# Patient Record
Sex: Female | Born: 1935 | Race: White | Hispanic: No | Smoking: Former smoker
Health system: Southern US, Community
[De-identification: ages and names within clinical notes are randomized; demographics above are authoritative.]

## PROBLEM LIST (undated history)

## (undated) DIAGNOSIS — I10 Essential (primary) hypertension: Secondary | ICD-10-CM

## (undated) DIAGNOSIS — H409 Unspecified glaucoma: Secondary | ICD-10-CM

## (undated) DIAGNOSIS — F0390 Unspecified dementia without behavioral disturbance: Secondary | ICD-10-CM

## (undated) DIAGNOSIS — E785 Hyperlipidemia, unspecified: Secondary | ICD-10-CM

## (undated) DIAGNOSIS — F419 Anxiety disorder, unspecified: Secondary | ICD-10-CM

## (undated) HISTORY — PX: ABDOMINAL SURGERY: SHX537

## (undated) HISTORY — PX: ABDOMINAL HYSTERECTOMY: SHX81

---

## 2013-03-16 ENCOUNTER — Emergency Department (HOSPITAL_BASED_OUTPATIENT_CLINIC_OR_DEPARTMENT_OTHER)
Admission: EM | Admit: 2013-03-16 | Discharge: 2013-03-16 | Disposition: A | Discharge status: Home / Self Care | Payer: Medicare Other | Attending: Emergency Medicine | Admitting: Emergency Medicine

## 2013-03-16 ENCOUNTER — Encounter (HOSPITAL_BASED_OUTPATIENT_CLINIC_OR_DEPARTMENT_OTHER): Payer: Self-pay | Admitting: Emergency Medicine

## 2013-03-16 ENCOUNTER — Emergency Department (HOSPITAL_BASED_OUTPATIENT_CLINIC_OR_DEPARTMENT_OTHER): Payer: Medicare Other

## 2013-03-16 DIAGNOSIS — F29 Unspecified psychosis not due to a substance or known physiological condition: Secondary | ICD-10-CM | POA: Insufficient documentation

## 2013-03-16 DIAGNOSIS — R319 Hematuria, unspecified: Secondary | ICD-10-CM

## 2013-03-16 DIAGNOSIS — R059 Cough, unspecified: Secondary | ICD-10-CM | POA: Insufficient documentation

## 2013-03-16 DIAGNOSIS — R05 Cough: Secondary | ICD-10-CM | POA: Insufficient documentation

## 2013-03-16 DIAGNOSIS — R509 Fever, unspecified: Secondary | ICD-10-CM

## 2013-03-16 DIAGNOSIS — F0390 Unspecified dementia without behavioral disturbance: Secondary | ICD-10-CM | POA: Insufficient documentation

## 2013-03-16 HISTORY — DX: Unspecified dementia, unspecified severity, without behavioral disturbance, psychotic disturbance, mood disturbance, and anxiety: F03.90

## 2013-03-16 LAB — BASIC METABOLIC PANEL
CO2: 26 mEq/L (ref 19–32)
Calcium: 9.2 mg/dL (ref 8.4–10.5)
Creatinine, Ser: 1 mg/dL (ref 0.50–1.10)
GFR calc Af Amer: 57 mL/min — ABNORMAL LOW (ref >90)
Glucose, Bld: 121 mg/dL — ABNORMAL HIGH (ref 70–99)
Sodium: 137 mEq/L (ref 137–147)

## 2013-03-16 LAB — CG4 I-STAT (LACTIC ACID): Lactic Acid, Venous: 0.81 mmol/L (ref 0.5–2.2)

## 2013-03-16 LAB — CBC WITH DIFFERENTIAL/PLATELET
Basophils Absolute: 0 10*3/uL (ref 0.0–0.1)
Basophils Relative: 0 % (ref 0–1)
Eosinophils Relative: 0 % (ref 0–5)
HCT: 37.7 % (ref 36.0–46.0)
Lymphs Abs: 0.8 10*3/uL (ref 0.7–4.0)
MCHC: 32.6 g/dL (ref 30.0–36.0)
MCV: 96.4 fL (ref 78.0–100.0)
Monocytes Absolute: 0.8 10*3/uL (ref 0.1–1.0)
Neutro Abs: 4.8 10*3/uL (ref 1.7–7.7)
Platelets: 142 10*3/uL — ABNORMAL LOW (ref 150–400)
RDW: 13.8 % (ref 11.5–15.5)
WBC: 6.4 10*3/uL (ref 4.0–10.5)

## 2013-03-16 LAB — URINALYSIS, ROUTINE W REFLEX MICROSCOPIC
Bilirubin Urine: NEGATIVE
Protein, ur: 30 mg/dL — AB
Specific Gravity, Urine: 1.021 (ref 1.005–1.030)
Urobilinogen, UA: 1 mg/dL (ref 0.0–1.0)
pH: 6 (ref 5.0–8.0)

## 2013-03-16 LAB — URINE MICROSCOPIC-ADD ON

## 2013-03-16 MED ORDER — DEXTROSE 5 % IV SOLN
1.0000 g | Freq: Once | INTRAVENOUS | Status: AC
  Administered 2013-03-16: 1 g via INTRAVENOUS

## 2013-03-16 MED ORDER — ACETAMINOPHEN 500 MG PO TABS: 500.0000 mg | ORAL_TABLET | Freq: Four times a day (QID) | ORAL | Status: DC | PRN

## 2013-03-16 MED ORDER — SODIUM CHLORIDE 0.9 % IV BOLUS (SEPSIS)
500.0000 mL | Freq: Once | INTRAVENOUS | Status: AC
  Administered 2013-03-16: 18:00:00 via INTRAVENOUS

## 2013-03-16 MED ORDER — ACETAMINOPHEN 325 MG PO TABS
ORAL_TABLET | ORAL | Status: AC
  Filled 2013-03-16: qty 2

## 2013-03-16 MED ORDER — ACETAMINOPHEN 325 MG PO TABS
650.0000 mg | ORAL_TABLET | Freq: Once | ORAL | Status: AC
  Administered 2013-03-16: 650 mg via ORAL

## 2013-03-16 MED ORDER — BACITRACIN-NEOMYCIN-POLYMYXIN 400-5-5000 EX OINT: 1.0000 "application " | TOPICAL_OINTMENT | Freq: Two times a day (BID) | CUTANEOUS | Status: DC

## 2013-03-16 MED ORDER — CEPHALEXIN 500 MG PO CAPS: 500.0000 mg | ORAL_CAPSULE | Freq: Four times a day (QID) | ORAL | Status: DC

## 2013-03-16 MED ORDER — CEFTRIAXONE SODIUM 1 G IJ SOLR
INTRAMUSCULAR | Status: AC
  Filled 2013-03-16: qty 10

## 2013-03-16 NOTE — ED Notes (Signed)
Called [facility] and spoke with Oneida Alar to inform them of pts return.

## 2013-03-16 NOTE — ED Provider Notes (Signed)
CSN: [id number]     Arrival date & time 03/16/13  1623 History   First MD Initiated Contact with Patient 03/16/13 1728     Chief Complaint  Patient presents with  . Altered Mental Status   (Consider location/radiation/quality/duration/timing/severity/associated sxs/prior Treatment) HPI 79 year old female with history of dementia who lives in an assisted living care facility presents today with fever and some increased confusion. Her daughter states that she saw her 2 evenings ago and that she was well at that time. She was called by the assisted living care facility with the fact that her mother had a fever today. She has had some cough. May have also been suspicious that she has a urinary tract infection but do not reveal any specific details indicate this. She has been taking by mouth as per usual per report from the assisted living care facility. Patient denies any nausea, vomiting, or diarrhea. History was obtained from the patient and from her daughter. Past Medical History  Diagnosis Date  . Dementia    Past Surgical History  Procedure Laterality Date  . Abdominal hysterectomy     History reviewed. No pertinent family history. History  Substance Use Topics  . Smoking status: Not on file  . Smokeless tobacco: Not on file  . Alcohol Use: No   OB History   Grav Para Term Preterm Abortions TAB SAB Ect Mult Living                 Review of Systems  All other systems reviewed and are negative.    Allergies  Review of patient's allergies indicates no known allergies.  Home Medications  No current outpatient prescriptions on file. BP 139/86  Pulse 81  Temp(Src) 101.1 F (38.4 C) (Rectal)  Resp 16  Ht 5\' 2"  (1.575 m)  Wt 110 lb (49.896 kg)  BMI 20.11 kg/m2  SpO2 96% Physical Exam  Nursing note and vitals reviewed. Constitutional: She is oriented to person, place, and time. She appears well-developed and well-nourished.  HENT:  Head: Normocephalic and atraumatic.   Right Ear: External ear normal.  Left Ear: External ear normal.  Nose: Nose normal.  Mouth/Throat: Oropharynx is clear and moist.  Eyes: Conjunctivae and EOM are normal. Pupils are equal, round, and reactive to light.  Neck: Normal range of motion. Neck supple.  Cardiovascular: Normal rate, regular rhythm, normal heart sounds and intact distal pulses.   Pulmonary/Chest: Effort normal and breath sounds normal.  Abdominal: Soft. Bowel sounds are normal.  Musculoskeletal: Normal range of motion.  Neurological: She is alert and oriented to person, place, and time. She has normal reflexes.  Skin: Skin is warm and dry.  Psychiatric: She has a normal mood and affect. Her behavior is normal. Thought content normal.    ED Course  Procedures (including critical care time) Labs Review Labs Reviewed  URINALYSIS, ROUTINE W REFLEX MICROSCOPIC - Abnormal; Notable for the following:    Hgb urine dipstick LARGE (*)    Protein, ur 30 (*)    All other components within normal limits  CBC WITH DIFFERENTIAL - Abnormal; Notable for the following:    Platelets 142 (*)    Monocytes Relative 13 (*)    All other components within normal limits  BASIC METABOLIC PANEL - Abnormal; Notable for the following:    Glucose, Bld 121 (*)    GFR calc non Af Amer 50 (*)    GFR calc Af Amer 57 (*)    All other components within normal limits  CULTURE, BLOOD (ROUTINE X 2)  CULTURE, BLOOD (ROUTINE X 2)  URINE MICROSCOPIC-ADD ON  CG4 I-STAT (LACTIC ACID)   Imaging Review Dg Chest 2 View  03/16/2013   CLINICAL DATA:  Fever.  Altered mental status.  EXAM: CHEST  2 VIEW  COMPARISON:  None.  FINDINGS: Heart size is within normal limits. Pulmonary hyperinflation is consistent with COPD. No evidence of pulmonary infiltrate or pleural effusion. No mass or lymphadenopathy identified.  IMPRESSION: COPD.  No active disease.   Electronically Signed   By: Myles Rosenthal M.D.   On: 03/16/2013 19:00    EKG Interpretation    None       MDM   1. Fever   2. Hematuria    79 year old female Dement dementia and fever here. She has hematuria but has not have any white blood cells noted. She was to have her urine cultured and is given IV Rocephin. She is started on keflex and will continue until culture results.  I have discussed return precautions with patient and daughter and they voice understanding.     Hilario Quarry, MD 03/16/13 [phone]

## 2013-03-16 NOTE — ED Notes (Signed)
Daughter reports pt has had a fever today and has been increasingly confused.

## 2013-03-16 NOTE — ED Notes (Signed)
Pt is from [facility] daughter reports fever and confusion x 3 days

## 2013-03-16 NOTE — ED Notes (Signed)
Patient transported to CT 

## 2013-03-17 MED ORDER — BACITRACIN-NEOMYCIN-POLYMYXIN 400-5-5000 EX OINT: 1.0000 "application " | TOPICAL_OINTMENT | Freq: Two times a day (BID) | CUTANEOUS | Status: DC

## 2013-03-17 MED ORDER — ACETAMINOPHEN 500 MG PO TABS: ORAL_TABLET | ORAL | Status: DC

## 2013-03-17 MED ORDER — ADVANCED PROBIOTIC 10 PO CAPS: 1.0000 | ORAL_CAPSULE | Freq: Every morning | ORAL | Status: DC

## 2013-03-23 LAB — CULTURE, BLOOD (ROUTINE X 2): Culture: NO GROWTH

## 2015-03-13 ENCOUNTER — Emergency Department (HOSPITAL_COMMUNITY): Payer: Medicare Other

## 2015-03-13 ENCOUNTER — Encounter (HOSPITAL_COMMUNITY): Payer: Self-pay | Admitting: Emergency Medicine

## 2015-03-13 ENCOUNTER — Emergency Department (HOSPITAL_COMMUNITY)
Admission: EM | Admit: 2015-03-13 | Discharge: 2015-03-13 | Disposition: A | Discharge status: Home / Self Care | Payer: Medicare Other | Attending: Emergency Medicine | Admitting: Emergency Medicine

## 2015-03-13 DIAGNOSIS — Z87891 Personal history of nicotine dependence: Secondary | ICD-10-CM | POA: Diagnosis not present

## 2015-03-13 DIAGNOSIS — H409 Unspecified glaucoma: Secondary | ICD-10-CM | POA: Insufficient documentation

## 2015-03-13 DIAGNOSIS — Y9389 Activity, other specified: Secondary | ICD-10-CM | POA: Diagnosis not present

## 2015-03-13 DIAGNOSIS — Z79899 Other long term (current) drug therapy: Secondary | ICD-10-CM | POA: Insufficient documentation

## 2015-03-13 DIAGNOSIS — S0993XA Unspecified injury of face, initial encounter: Secondary | ICD-10-CM | POA: Diagnosis present

## 2015-03-13 DIAGNOSIS — Y92129 Unspecified place in nursing home as the place of occurrence of the external cause: Secondary | ICD-10-CM | POA: Diagnosis not present

## 2015-03-13 DIAGNOSIS — F419 Anxiety disorder, unspecified: Secondary | ICD-10-CM | POA: Diagnosis not present

## 2015-03-13 DIAGNOSIS — I10 Essential (primary) hypertension: Secondary | ICD-10-CM | POA: Diagnosis not present

## 2015-03-13 DIAGNOSIS — Y998 Other external cause status: Secondary | ICD-10-CM | POA: Diagnosis not present

## 2015-03-13 DIAGNOSIS — F0390 Unspecified dementia without behavioral disturbance: Secondary | ICD-10-CM | POA: Insufficient documentation

## 2015-03-13 DIAGNOSIS — Z8639 Personal history of other endocrine, nutritional and metabolic disease: Secondary | ICD-10-CM | POA: Insufficient documentation

## 2015-03-13 DIAGNOSIS — W01198A Fall on same level from slipping, tripping and stumbling with subsequent striking against other object, initial encounter: Secondary | ICD-10-CM | POA: Insufficient documentation

## 2015-03-13 DIAGNOSIS — W19XXXA Unspecified fall, initial encounter: Secondary | ICD-10-CM

## 2015-03-13 DIAGNOSIS — S0181XA Laceration without foreign body of other part of head, initial encounter: Secondary | ICD-10-CM | POA: Insufficient documentation

## 2015-03-13 HISTORY — DX: Essential (primary) hypertension: I10

## 2015-03-13 HISTORY — DX: Anxiety disorder, unspecified: F41.9

## 2015-03-13 HISTORY — DX: Unspecified glaucoma: H40.9

## 2015-03-13 HISTORY — DX: Hyperlipidemia, unspecified: E78.5

## 2015-03-13 NOTE — Progress Notes (Signed)
Pt pcp is at [facility]. [address], [address] [place], [place] [address] t: [phone] Visiting doctors Latrice Polite per daughter  

## 2015-03-13 NOTE — ED Notes (Signed)
Awake. Verbally responsive. A/O x4. Resp even and unlabored. No audible adventitious breath sounds noted. ABC's intact. Family at bedside. C-collar in place.

## 2015-03-13 NOTE — ED Notes (Signed)
Awake. Verbally responsive. A/O x4. Resp even and unlabored. No audible adventitious breath sounds noted. ABC's intact.  

## 2015-03-13 NOTE — ED Notes (Addendum)
Dr. Aileen PilotZammitt at bedside and applied staples to lac on forehead. Pt tolerated well.

## 2015-03-13 NOTE — Progress Notes (Signed)
CSW attempted to speak with patient. Nurse at bedside.   Angela Shaffer, LCSWA [phone] ED CSW 03/13/2015 1:20 PM

## 2015-03-13 NOTE — Discharge Instructions (Signed)
Follow up to have staples out in one week

## 2015-03-13 NOTE — ED Notes (Signed)
Pt arrived via EMS from [facility]) with report of pt tripping and falling unwitnessed hitting head on wooden ledge causing abrasion to rt forehead with controlled bleeding. EMS applied c-collar. Pt denies neck/back pain. Pt reported rt knee pain but no deformities/swelling noted. Pt denies dizziness/lightheadedness,visual disturbances, headaches, or LOC.

## 2015-03-13 NOTE — ED Notes (Signed)
Bed: WA16 Expected date:  Expected time:  Means of arrival:  Comments: EMS 
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
## 2015-03-13 NOTE — ED Notes (Signed)
Awake. Verbally responsive. A/O x4. Resp even and unlabored. No audible adventitious breath sounds noted. ABC's intact. C-collar remains in place. Family at bedside.

## 2015-03-13 NOTE — ED Provider Notes (Signed)
CSN: [id number]     Arrival date & time 03/13/15  1237 History   First MD Initiated Contact with Patient 03/13/15 1308     Chief Complaint  Patient presents with  . Fall     (Consider location/radiation/quality/duration/timing/severity/associated sxs/prior Treatment) Patient is a 81 y.o. female presenting with fall. The history is provided by a relative (Patient fell and hit her head no loss of consciousness).  Fall This is a new problem. The current episode started 1 to 2 hours ago. The problem occurs constantly. The problem has not changed since onset.Pertinent negatives include no chest pain, no abdominal pain and no headaches. Nothing aggravates the symptoms. Nothing relieves the symptoms.    Past Medical History  Diagnosis Date  . Dementia   . Hypertension   . Glaucoma   . HLD (hyperlipidemia)   . Anxiety    Past Surgical History  Procedure Laterality Date  . Abdominal hysterectomy    . Abdominal surgery     Family History  Problem Relation Age of Onset  . Family history unknown: Yes   Social History  Substance Use Topics  . Smoking status: Former Games developermoker  . Smokeless tobacco: None  . Alcohol Use: No   OB History    No data available     Review of Systems  Constitutional: Negative for appetite change and fatigue.  HENT: Negative for congestion, ear discharge and sinus pressure.   Eyes: Negative for discharge.  Respiratory: Negative for cough.   Cardiovascular: Negative for chest pain.  Gastrointestinal: Negative for abdominal pain and diarrhea.  Genitourinary: Negative for frequency and hematuria.  Musculoskeletal: Negative for back pain.  Skin: Negative for rash.  Neurological: Negative for seizures and headaches.  Psychiatric/Behavioral: Negative for hallucinations.      Allergies  Multivitamin  Home Medications   Prior to Admission medications   Medication Sig Start Date End Date Taking? Authorizing Provider  acetaminophen (TYLENOL) 325 MG  tablet Take 325 mg by mouth every 4 (four) hours as needed for mild pain, moderate pain or headache.   Yes Historical Provider, MD  acetaminophen (TYLENOL) 500 MG tablet Take 1 tablet (500 mg total) by mouth every 6 (six) hours as needed. Patient taking differently: Take 500 mg by mouth every 6 (six) hours as needed for mild pain or moderate pain.  03/16/13  Yes Margarita Grizzleanielle Ray, MD  Cholecalciferol (VITAMIN D3) 2000 UNITS TABS Take 2,000 Units by mouth daily.   Yes Historical Provider, MD  hydrocortisone cream 1 % Apply 1 application topically 2 (two) times daily. Apply 2 inches topically to neck, back, and between thighs twice daily   Yes Historical Provider, MD  ibuprofen (ADVIL,MOTRIN) 400 MG tablet Take 400 mg by mouth every 6 (six) hours as needed for headache, mild pain or moderate pain.   Yes Historical Provider, MD  latanoprost (XALATAN) 0.005 % ophthalmic solution Place 1 drop into both eyes at bedtime.   Yes Historical Provider, MD  LORazepam (ATIVAN) 0.5 MG tablet Take 0.25 mg by mouth daily.   Yes Historical Provider, MD  LORazepam (ATIVAN) 0.5 MG tablet Take 0.25 mg by mouth daily as needed for anxiety.   Yes Historical Provider, MD  mirtazapine (REMERON) 7.5 MG tablet Take 7.5 mg by mouth at bedtime. 03/04/15  Yes Historical Provider, MD  timolol (BETIMOL) 0.5 % ophthalmic solution Place 1 drop into both eyes 2 (two) times daily.   Yes Historical Provider, MD  acetaminophen (TYLENOL) 500 MG tablet One tablet every 4 hours as  needed for pain or fever over 100. Patient not taking: Reported on 03/13/2015 03/17/13   Elson Areas, PA-C  neomycin-bacitracin-polymyxin (NEOSPORIN) ointment Apply 1 application topically every 12 (twelve) hours. apply to eye Patient not taking: Reported on 03/13/2015 03/16/13   Margarita Grizzle, MD  neomycin-bacitracin-polymyxin (NEOSPORIN) ointment Apply 1 application topically 2 (two) times daily. Apply to face Patient not taking: Reported on 03/13/2015 03/17/13    Geoffery Lyons, MD  Probiotic Product (ADVANCED PROBIOTIC 10) CAPS Take 1 capsule by mouth every morning. Patient not taking: Reported on 03/13/2015 03/17/13   Geoffery Lyons, MD   BP 183/66 mmHg  Pulse 69  Temp(Src) 98.9 F (37.2 C) (Oral)  Resp 18  SpO2 98% Physical Exam  Constitutional: She appears well-developed.  HENT:  Head: Normocephalic.  Eyes: Conjunctivae and EOM are normal. No scleral icterus.  Neck: Neck supple. No thyromegaly present.  Cardiovascular: Normal rate and regular rhythm.  Exam reveals no gallop and no friction rub.   No murmur heard. Pulmonary/Chest: No stridor. She has no wheezes. She has no rales. She exhibits no tenderness.  Abdominal: She exhibits no distension. There is no tenderness. There is no rebound.  Musculoskeletal: Normal range of motion. She exhibits no edema.  Lymphadenopathy:    She has no cervical adenopathy.  Neurological: She is alert. She exhibits normal muscle tone. Coordination normal.  Skin: No rash noted. No erythema.  Psychiatric: She has a normal mood and affect. Her behavior is normal.    ED Course  .Marland KitchenLaceration Repair Date/Time: 03/13/2015 3:44 PM Performed by: Bethann Berkshire Authorized by: Bethann Berkshire Comments: Patient has a 1.5 cm to right forehead. Area was cleaned with Betadine and 3 staples were used to close laceration   (including critical care time) Labs Review Labs Reviewed - No data to display  Imaging Review Ct Head Wo Contrast  03/13/2015  CLINICAL DATA:  Unwitnessed trip and fall, hitting head on wooden ledge. Right forehead abrasion. Initial encounter. EXAM: CT HEAD WITHOUT CONTRAST CT CERVICAL SPINE WITHOUT CONTRAST TECHNIQUE: Multidetector CT imaging of the head and cervical spine was performed following the standard protocol without intravenous contrast. Multiplanar CT image reconstructions of the cervical spine were also generated. COMPARISON:  None. FINDINGS: CT HEAD FINDINGS A subcentimeter infarct at the  junction of the midbrain and pons right of midline appears chronic. There are additional infarcts in the left greater than right basal ganglia and left thalamus which are of indeterminate acuity. Punctate density in the left globus pallidus is favored to reflect calcification. There is no definite evidence of acute intracranial hemorrhage. No mass, midline shift, or extra-axial fluid collection is seen. There is moderate cerebral atrophy. Periventricular white-matter hypodensities are nonspecific but compatible with moderate chronic small vessel ischemic disease. Prior bilateral cataract extraction is noted. There is mild right frontal scalp swelling. The visualized paranasal sinuses and mastoid air cells are clear. No skull fracture is seen. Calcified atherosclerosis is noted at the skullbase. CT CERVICAL SPINE FINDINGS There is straightening of the normal cervical lordosis. There is trace anterolisthesis of C3 on C4 and C7 on T1 and trace retrolisthesis of C4 on C5 which appear degenerative. No acute cervical spine fracture is identified. Advanced atlantodental degenerative changes are present with moderate calcified pannus noted about the dens. There is advanced right facet arthrosis at C2-3. Posterior element ankylosis and partial fusion across the disc spacer noted at C3-4. Moderate to severe disc space narrowing is present from C4-5 to C7-T1. Associated degenerative endplate changes are present.  Carotid atherosclerosis is noted. IMPRESSION: 1. Age indeterminate infarcts in the bilateral basal ganglia and left thalamus. Chronic upper brainstem infarct. 2. No acute intracranial hemorrhage identified. 3. Moderate chronic small vessel ischemic disease and cerebral atrophy. 4. Right frontal scalp swelling. 5. Advanced cervical disc degeneration without acute osseous abnormality identified. Electronically Signed   By: Sebastian Ache M.D.   On: 03/13/2015 14:16   Ct Cervical Spine Wo Contrast  03/13/2015  CLINICAL  DATA:  Unwitnessed trip and fall, hitting head on wooden ledge. Right forehead abrasion. Initial encounter. EXAM: CT HEAD WITHOUT CONTRAST CT CERVICAL SPINE WITHOUT CONTRAST TECHNIQUE: Multidetector CT imaging of the head and cervical spine was performed following the standard protocol without intravenous contrast. Multiplanar CT image reconstructions of the cervical spine were also generated. COMPARISON:  None. FINDINGS: CT HEAD FINDINGS A subcentimeter infarct at the junction of the midbrain and pons right of midline appears chronic. There are additional infarcts in the left greater than right basal ganglia and left thalamus which are of indeterminate acuity. Punctate density in the left globus pallidus is favored to reflect calcification. There is no definite evidence of acute intracranial hemorrhage. No mass, midline shift, or extra-axial fluid collection is seen. There is moderate cerebral atrophy. Periventricular white-matter hypodensities are nonspecific but compatible with moderate chronic small vessel ischemic disease. Prior bilateral cataract extraction is noted. There is mild right frontal scalp swelling. The visualized paranasal sinuses and mastoid air cells are clear. No skull fracture is seen. Calcified atherosclerosis is noted at the skullbase. CT CERVICAL SPINE FINDINGS There is straightening of the normal cervical lordosis. There is trace anterolisthesis of C3 on C4 and C7 on T1 and trace retrolisthesis of C4 on C5 which appear degenerative. No acute cervical spine fracture is identified. Advanced atlantodental degenerative changes are present with moderate calcified pannus noted about the dens. There is advanced right facet arthrosis at C2-3. Posterior element ankylosis and partial fusion across the disc spacer noted at C3-4. Moderate to severe disc space narrowing is present from C4-5 to C7-T1. Associated degenerative endplate changes are present. Carotid atherosclerosis is noted. IMPRESSION: 1.  Age indeterminate infarcts in the bilateral basal ganglia and left thalamus. Chronic upper brainstem infarct. 2. No acute intracranial hemorrhage identified. 3. Moderate chronic small vessel ischemic disease and cerebral atrophy. 4. Right frontal scalp swelling. 5. Advanced cervical disc degeneration without acute osseous abnormality identified. Electronically Signed   By: Sebastian Ache M.D.   On: 03/13/2015 14:16   I have personally reviewed and evaluated these images and lab results as part of my medical decision-making.   EKG Interpretation None      MDM   Final diagnoses:  None   Patient fell hit her head no loss of consciousness CT scan head and neck negative. Patient will follow up with PCP as needed    Bethann Berkshire, MD 03/15/15 [phone]

## 2015-03-13 NOTE — ED Notes (Signed)
Patient transported to CT 

## 2015-03-13 NOTE — ED Notes (Signed)
Daughter at bedside and refusing to allow nurse to undress pt for CT exam. Made CT tech aware and verbalized understanding.

## 2016-04-17 ENCOUNTER — Encounter (HOSPITAL_BASED_OUTPATIENT_CLINIC_OR_DEPARTMENT_OTHER): Payer: Self-pay | Admitting: *Deleted

## 2016-04-17 ENCOUNTER — Emergency Department (HOSPITAL_BASED_OUTPATIENT_CLINIC_OR_DEPARTMENT_OTHER)
Admission: EM | Admit: 2016-04-17 | Discharge: 2016-04-18 | Disposition: A | Discharge status: Home / Self Care | Payer: Medicare Other | Attending: Emergency Medicine | Admitting: Emergency Medicine

## 2016-04-17 DIAGNOSIS — Z87891 Personal history of nicotine dependence: Secondary | ICD-10-CM | POA: Diagnosis not present

## 2016-04-17 DIAGNOSIS — A09 Infectious gastroenteritis and colitis, unspecified: Secondary | ICD-10-CM | POA: Insufficient documentation

## 2016-04-17 DIAGNOSIS — R197 Diarrhea, unspecified: Secondary | ICD-10-CM

## 2016-04-17 DIAGNOSIS — I10 Essential (primary) hypertension: Secondary | ICD-10-CM | POA: Diagnosis not present

## 2016-04-17 DIAGNOSIS — G309 Alzheimer's disease, unspecified: Secondary | ICD-10-CM | POA: Diagnosis not present

## 2016-04-17 DIAGNOSIS — Z79899 Other long term (current) drug therapy: Secondary | ICD-10-CM | POA: Insufficient documentation

## 2016-04-17 DIAGNOSIS — E860 Dehydration: Secondary | ICD-10-CM | POA: Diagnosis not present

## 2016-04-17 LAB — URINALYSIS, ROUTINE W REFLEX MICROSCOPIC
Glucose, UA: NEGATIVE mg/dL
Hgb urine dipstick: NEGATIVE
KETONES UR: 15 mg/dL — AB
Nitrite: NEGATIVE
PH: 5.5 (ref 5.0–8.0)
Protein, ur: NEGATIVE mg/dL
Specific Gravity, Urine: 1.024 (ref 1.005–1.030)

## 2016-04-17 LAB — COMPREHENSIVE METABOLIC PANEL
ALBUMIN: 3.3 g/dL — AB (ref 3.5–5.0)
ALT: 18 U/L (ref 14–54)
ANION GAP: 9 (ref 5–15)
AST: 28 U/L (ref 15–41)
Alkaline Phosphatase: 46 U/L (ref 38–126)
BILIRUBIN TOTAL: 0.6 mg/dL (ref 0.3–1.2)
BUN: 25 mg/dL — ABNORMAL HIGH (ref 6–20)
CO2: 25 mmol/L (ref 22–32)
Calcium: 8.6 mg/dL — ABNORMAL LOW (ref 8.9–10.3)
Chloride: 107 mmol/L (ref 101–111)
Creatinine, Ser: 0.9 mg/dL (ref 0.44–1.00)
GFR calc non Af Amer: 55 mL/min — ABNORMAL LOW (ref >60)
GLUCOSE: 169 mg/dL — AB (ref 65–99)
POTASSIUM: 3.5 mmol/L (ref 3.5–5.1)
Sodium: 141 mmol/L (ref 135–145)
TOTAL PROTEIN: 6.5 g/dL (ref 6.5–8.1)

## 2016-04-17 LAB — URINALYSIS, MICROSCOPIC (REFLEX): RBC / HPF: NONE SEEN RBC/hpf (ref 0–5)

## 2016-04-17 LAB — CBC WITH DIFFERENTIAL/PLATELET
BASOS PCT: 0 %
Basophils Absolute: 0 10*3/uL (ref 0.0–0.1)
EOS ABS: 0 10*3/uL (ref 0.0–0.7)
Eosinophils Relative: 0 %
HEMATOCRIT: 41.3 % (ref 36.0–46.0)
Hemoglobin: 13.9 g/dL (ref 12.0–15.0)
Lymphocytes Relative: 9 %
Lymphs Abs: 1.2 10*3/uL (ref 0.7–4.0)
MCH: 32.1 pg (ref 26.0–34.0)
MCHC: 33.7 g/dL (ref 30.0–36.0)
MCV: 95.4 fL (ref 78.0–100.0)
MONO ABS: 1.5 10*3/uL — AB (ref 0.1–1.0)
MONOS PCT: 12 %
NEUTROS ABS: 9.9 10*3/uL — AB (ref 1.7–7.7)
Neutrophils Relative %: 79 %
Platelets: 241 10*3/uL (ref 150–400)
RBC: 4.33 MIL/uL (ref 3.87–5.11)
RDW: 13.3 % (ref 11.5–15.5)
WBC: 12.6 10*3/uL — ABNORMAL HIGH (ref 4.0–10.5)

## 2016-04-17 MED ORDER — ZINC OXIDE 11.3 % EX CREA
TOPICAL_CREAM | CUTANEOUS | Status: AC
  Filled 2016-04-17: qty 56

## 2016-04-17 MED ORDER — SODIUM CHLORIDE 0.9 % IV SOLN
Freq: Once | INTRAVENOUS | Status: AC
  Administered 2016-04-17: 500 mL via INTRAVENOUS

## 2016-04-17 MED ORDER — SODIUM CHLORIDE 0.9 % IV BOLUS (SEPSIS)
500.0000 mL | Freq: Once | INTRAVENOUS | Status: AC
  Administered 2016-04-18: 500 mL via INTRAVENOUS

## 2016-04-17 MED ORDER — LOPERAMIDE HCL 2 MG PO CAPS
2.0000 mg | ORAL_CAPSULE | Freq: Once | ORAL | Status: AC
  Administered 2016-04-18: 2 mg via ORAL
  Filled 2016-04-17: qty 1

## 2016-04-17 NOTE — ED Notes (Signed)
Diarrhea x 1 days  C/o abd pain   Hx of same 2 times past months

## 2016-04-17 NOTE — ED Provider Notes (Signed)
[facility] Provider Note: Angela Dell, MD, FACEP  CSN: [id number] MRN: [id number] ARRIVAL: 04/17/16 at 2140 ROOM: MH12/[facility]12  By signing my name below, I, Linna Darner, attest that this documentation has been prepared under the direction and in the presence of physician practitioner, Paula Libra, MD. Electronically Signed: Linna Darner, Scribe. 04/17/2016. 11:02 PM.  CHIEF COMPLAINT  Diarrhea   HISTORY OF PRESENT ILLNESS   HPI Comments: LEVEL 5 CAVEAT FOR ALZHEIMER'S DEMENTIA Angela Shaffer is a 83 y.o. female brought in by family who presents to the Emergency Department complaining of persistent diarrhea beginning this morning. Daughter reports that this is patient's third instance of diarrhea in the last month. Daughter states that patient complained of abdominal pain and nausea this morning, but notes these symptoms have resolved. Per daughter, patient has recently had some generalized weakness and has not been eating or drinking well; pt has only had "a few sips of ginger ale" today. Daughter states that patient has been more lethargic than usual. No recent antibiotic use. No h/o heart or kidney failure. There has been no vomiting and fever. Pt lives at [facility].  Patient was given 0.5L IV fluid here and family states she is acting more like herself, more alert and interactive.  Past Medical History:  Diagnosis Date  . Anxiety   . Dementia   . Glaucoma   . HLD (hyperlipidemia)   . Hypertension     Past Surgical History:  Procedure Laterality Date  . ABDOMINAL HYSTERECTOMY    . ABDOMINAL SURGERY      Family History  Problem Relation Age of Onset  . Family history unknown: Yes    Social History  Substance Use Topics  . Smoking status: Former Games developer  . Smokeless tobacco: Never Used  . Alcohol use No    Prior to Admission medications   Medication Sig Start Date End Date Taking? Authorizing Provider  divalproex (DEPAKOTE) 125 MG  DR tablet Take 125 mg by mouth 2 (two) times daily.   Yes Historical Provider, MD  acetaminophen (TYLENOL) 325 MG tablet Take 325 mg by mouth every 4 (four) hours as needed for mild pain, moderate pain or headache.    Historical Provider, MD  Cholecalciferol (VITAMIN D3) 2000 UNITS TABS Take 2,000 Units by mouth daily.    Historical Provider, MD  hydrocortisone cream 1 % Apply 1 application topically 2 (two) times daily. Apply 2 inches topically to neck, back, and between thighs twice daily    Historical Provider, MD  ibuprofen (ADVIL,MOTRIN) 400 MG tablet Take 400 mg by mouth every 6 (six) hours as needed for headache, mild pain or moderate pain.    Historical Provider, MD  latanoprost (XALATAN) 0.005 % ophthalmic solution Place 1 drop into both eyes at bedtime.    Historical Provider, MD  LORazepam (ATIVAN) 0.5 MG tablet Take 0.25 mg by mouth daily.    Historical Provider, MD  mirtazapine (REMERON) 7.5 MG tablet Take 7.5 mg by mouth at bedtime. 03/04/15   Historical Provider, MD  timolol (BETIMOL) 0.5 % ophthalmic solution Place 1 drop into both eyes 2 (two) times daily.    Historical Provider, MD    Allergies Multivitamin [centrum]   REVIEW OF SYSTEMS  Negative except as noted here or in the History of Present Illness.   PHYSICAL EXAMINATION  Initial Vital Signs Blood pressure 116/73, pulse 95, temperature 98.7 F (37.1 C), temperature source Oral, resp. rate 16, height 5\' 2"  (1.575 m), weight 110 lb (  49.9 kg), SpO2 96 %.  Examination General: Well-developed, well-nourished female in no acute distress; appearance consistent with age of record HENT: normocephalic; atraumatic Eyes: pupils equal, round and reactive to light; lens implants Neck: supple Heart: regular rate and rhythm Lungs: clear to auscultation bilaterally Abdomen: soft; nondistended; nontender; no masses or hepatosplenomegaly; bowel sounds present Extremities: Arthritic changes; pulses normal Neurologic: Awake,  alert; Incomprehensible speech; motor function intact in all extremities and symmetric; no facial droop Skin: Warm and dry Psychiatric: Normal mood and affect   RESULTS  Summary of this visit's results, reviewed by myself:   EKG Interpretation  Date/Time:    Ventricular Rate:    PR Interval:    QRS Duration:   QT Interval:    QTC Calculation:   R Axis:     Text Interpretation:        Laboratory Studies: Results for orders placed or performed during the hospital encounter of 04/17/16 (from the past 24 hour(s))  Urinalysis, Routine w reflex microscopic     Status: Abnormal   Collection Time: 04/17/16 10:20 PM  Result Value Ref Range   Color, Urine AMBER (A) YELLOW   APPearance CLEAR CLEAR   Specific Gravity, Urine 1.024 1.005 - 1.030   pH 5.5 5.0 - 8.0   Glucose, UA NEGATIVE NEGATIVE mg/dL   Hgb urine dipstick NEGATIVE NEGATIVE   Bilirubin Urine SMALL (A) NEGATIVE   Ketones, ur 15 (A) NEGATIVE mg/dL   Protein, ur NEGATIVE NEGATIVE mg/dL   Nitrite NEGATIVE NEGATIVE   Leukocytes, UA TRACE (A) NEGATIVE  Urinalysis, Microscopic (reflex)     Status: Abnormal   Collection Time: 04/17/16 10:20 PM  Result Value Ref Range   RBC / HPF NONE SEEN 0 - 5 RBC/hpf   WBC, UA 0-5 0 - 5 WBC/hpf   Bacteria, UA RARE (A) NONE SEEN   Squamous Epithelial / LPF 0-5 (A) NONE SEEN  CBC with Differential     Status: Abnormal   Collection Time: 04/17/16 10:32 PM  Result Value Ref Range   WBC 12.6 (H) 4.0 - 10.5 K/uL   RBC 4.33 3.87 - 5.11 MIL/uL   Hemoglobin 13.9 12.0 - 15.0 g/dL   HCT 13.041.3 86.536.0 - 78.446.0 %   MCV 95.4 78.0 - 100.0 fL   MCH 32.1 26.0 - 34.0 pg   MCHC 33.7 30.0 - 36.0 g/dL   RDW 69.613.3 29.511.5 - 28.415.5 %   Platelets 241 150 - 400 K/uL   Neutrophils Relative % 79 %   Neutro Abs 9.9 (H) 1.7 - 7.7 K/uL   Lymphocytes Relative 9 %   Lymphs Abs 1.2 0.7 - 4.0 K/uL   Monocytes Relative 12 %   Monocytes Absolute 1.5 (H) 0.1 - 1.0 K/uL   Eosinophils Relative 0 %   Eosinophils Absolute 0.0  0.0 - 0.7 K/uL   Basophils Relative 0 %   Basophils Absolute 0.0 0.0 - 0.1 K/uL  Comprehensive metabolic panel     Status: Abnormal   Collection Time: 04/17/16 10:32 PM  Result Value Ref Range   Sodium 141 135 - 145 mmol/L   Potassium 3.5 3.5 - 5.1 mmol/L   Chloride 107 101 - 111 mmol/L   CO2 25 22 - 32 mmol/L   Glucose, Bld 169 (H) 65 - 99 mg/dL   BUN 25 (H) 6 - 20 mg/dL   Creatinine, Ser 1.320.90 0.44 - 1.00 mg/dL   Calcium 8.6 (L) 8.9 - 10.3 mg/dL   Total Protein 6.5 6.5 - 8.1 g/dL  Albumin 3.3 (L) 3.5 - 5.0 g/dL   AST 28 15 - 41 U/L   ALT 18 14 - 54 U/L   Alkaline Phosphatase 46 38 - 126 U/L   Total Bilirubin 0.6 0.3 - 1.2 mg/dL   GFR calc non Af Amer 55 (L) >60 mL/min   GFR calc Af Amer >60 >60 mL/min   Anion gap 9 5 - 15   Imaging Studies: No results found.  ED COURSE  Nursing notes and initial vitals signs, including pulse oximetry, reviewed.  Vitals:   04/17/16 2204 04/17/16 2206 04/18/16 0018  BP: 116/73    Pulse: 95  89  Resp: 16  18  Temp: 98.7 F (37.1 C)    TempSrc: Oral    SpO2: 96%  99%  Weight:  110 lb (49.9 kg)   Height:  5\' 2"  (1.575 m)     PROCEDURES    ED DIAGNOSES     ICD-9-CM ICD-10-CM   1. Diarrhea of presumed infectious origin 009.3 A09   2. Dehydration 276.51 E86.0     I personally performed the services described in this documentation, which was scribed in my presence. The recorded information has been reviewed and is accurate.    Paula Libra, MD 04/18/16 [id number]

## 2016-04-17 NOTE — ED Triage Notes (Signed)
Pt with diarrhea that began this am family states  That this is the 3rd episode of diarrhea in one month family report a change in mental status with this episode .

## 2016-04-18 DIAGNOSIS — A09 Infectious gastroenteritis and colitis, unspecified: Secondary | ICD-10-CM | POA: Diagnosis not present

## 2016-04-19 LAB — URINE CULTURE
Culture: NO GROWTH
SPECIAL REQUESTS: NORMAL

## 2016-10-05 ENCOUNTER — Encounter (HOSPITAL_BASED_OUTPATIENT_CLINIC_OR_DEPARTMENT_OTHER): Payer: Self-pay

## 2016-10-05 ENCOUNTER — Emergency Department (HOSPITAL_BASED_OUTPATIENT_CLINIC_OR_DEPARTMENT_OTHER)
Admission: EM | Admit: 2016-10-05 | Discharge: 2016-10-05 | Disposition: A | Discharge status: Home / Self Care | Payer: Medicare Other | Attending: Emergency Medicine | Admitting: Emergency Medicine

## 2016-10-05 DIAGNOSIS — N3001 Acute cystitis with hematuria: Secondary | ICD-10-CM

## 2016-10-05 DIAGNOSIS — I10 Essential (primary) hypertension: Secondary | ICD-10-CM | POA: Insufficient documentation

## 2016-10-05 DIAGNOSIS — Z79899 Other long term (current) drug therapy: Secondary | ICD-10-CM | POA: Insufficient documentation

## 2016-10-05 DIAGNOSIS — R319 Hematuria, unspecified: Secondary | ICD-10-CM | POA: Diagnosis present

## 2016-10-05 LAB — URINALYSIS, ROUTINE W REFLEX MICROSCOPIC
Glucose, UA: NEGATIVE mg/dL
KETONES UR: 15 mg/dL — AB
NITRITE: POSITIVE — AB
PH: 5 (ref 5.0–8.0)
PROTEIN: 100 mg/dL — AB
Specific Gravity, Urine: 1.022 (ref 1.005–1.030)

## 2016-10-05 LAB — URINALYSIS, MICROSCOPIC (REFLEX)

## 2016-10-05 MED ORDER — CEPHALEXIN 250 MG/5ML PO SUSR: 500.0000 mg | Freq: Four times a day (QID) | ORAL | 0 refills | Status: AC

## 2016-10-05 NOTE — ED Notes (Signed)
Pt was getting agitated and pt's POA refused discharge V/S.

## 2016-10-05 NOTE — ED Triage Notes (Signed)
Per pt's daughter staff at the memory care unit noted blood in her pull up and blood in the toilet after urinating.

## 2016-10-05 NOTE — ED Notes (Signed)
Notified lab for culture on urine specimen

## 2016-10-05 NOTE — ED Provider Notes (Signed)
[facility]   CSN: [id number] Arrival date & time: 10/05/16  1334     History   Chief Complaint Chief Complaint  Patient presents with  . Hematuria   Level V caveat due to dementia HPI Angela Shaffer is a 83 y.o. female.  HPI Patient was brought in by her daughter. Reportedly had blood in her pull-up today. Patient's been without complaints. She is at her baseline dementia no fevers. No trauma. She is not on anticoagulation. She's had good appetite and has been eating well. No fevers. Past Medical History:  Diagnosis Date  . Anxiety   . Dementia   . Glaucoma   . HLD (hyperlipidemia)   . Hypertension     There are no active problems to display for this patient.   Past Surgical History:  Procedure Laterality Date  . ABDOMINAL HYSTERECTOMY    . ABDOMINAL SURGERY      OB History    No data available       Home Medications    Prior to Admission medications   Medication Sig Start Date End Date Taking? Authorizing Provider  sertraline (ZOLOFT) 20 MG/ML concentrated solution Take by mouth daily.   Yes [provider]  acetaminophen (TYLENOL) 325 MG tablet Take 325 mg by mouth every 4 (four) hours as needed for mild pain, moderate pain or headache.    [provider]  cephALEXin (KEFLEX) 250 MG/5ML suspension Take 10 mLs (500 mg total) by mouth 4 (four) times daily. 10/05/16 10/12/16  Benjiman Core, MD  Cholecalciferol (VITAMIN D3) 2000 UNITS TABS Take 2,000 Units by mouth daily.    [provider]  divalproex (DEPAKOTE) 125 MG DR tablet Take 125 mg by mouth 2 (two) times daily.    [provider]  hydrocortisone cream 1 % Apply 1 application topically 2 (two) times daily. Apply 2 inches topically to neck, back, and between thighs twice daily    [provider]  ibuprofen (ADVIL,MOTRIN) 400 MG tablet Take 400 mg by mouth every 6 (six) hours as needed for headache, mild pain or moderate pain.     [provider]  latanoprost (XALATAN) 0.005 % ophthalmic solution Place 1 drop into both eyes at bedtime.    [provider]  LORazepam (ATIVAN) 0.5 MG tablet Take 0.25 mg by mouth daily.    [provider]  mirtazapine (REMERON) 7.5 MG tablet Take 7.5 mg by mouth at bedtime. 03/04/15   [provider]  timolol (BETIMOL) 0.5 % ophthalmic solution Place 1 drop into both eyes 2 (two) times daily.    [provider]    Family History Family History  Problem Relation Age of Onset  . Family history unknown: Yes    Social History Social History  Substance Use Topics  . Smoking status: Former Games developer  . Smokeless tobacco: Never Used  . Alcohol use No     Allergies   Multivitamin [centrum]   Review of Systems Review of Systems  Unable to perform ROS: Dementia     Physical Exam Updated Vital Signs BP (!) 127/54 (BP Location: Right Arm)   Pulse 94   Temp 98.7 F (37.1 C) (Oral)   Resp 18   Ht 5\' 2"  (1.575 m)   Wt 49.9 kg (110 lb)   SpO2 98%   BMI 20.12 kg/m   Physical Exam  Constitutional: She appears well-developed.  HENT:  Head: Normocephalic.  Eyes: Pupils are equal, round, and reactive to light.  Neck:  Neck supple.  Cardiovascular: Normal rate.   Pulmonary/Chest: Effort normal.  Abdominal: Soft. There is no tenderness.  Genitourinary:  Genitourinary Comments: No CVA tenderness  Musculoskeletal: She exhibits no edema.  Neurological: She is alert.  Patient is at her baseline dementia per her daughter  Skin: Skin is warm. Capillary refill takes less than 2 seconds.     ED Treatments / Results  Labs (all labs ordered are listed, but only abnormal results are displayed) Labs Reviewed  URINALYSIS, ROUTINE W REFLEX MICROSCOPIC - Abnormal; Notable for the following:       Result Value   Color, Urine AMBER (*)    APPearance TURBID (*)    Hgb urine dipstick LARGE (*)    Bilirubin Urine MODERATE (*)    Ketones, ur 15  (*)    Protein, ur 100 (*)    Nitrite POSITIVE (*)    Leukocytes, UA LARGE (*)    All other components within normal limits  URINALYSIS, MICROSCOPIC (REFLEX) - Abnormal; Notable for the following:    Bacteria, UA MANY (*)    Squamous Epithelial / LPF 0-5 (*)    All other components within normal limits  URINE CULTURE    EKG  EKG Interpretation None       Radiology No results found.  Procedures Procedures (including critical care time)  Medications Ordered in ED Medications - No data to display   Initial Impression / Assessment and Plan / ED Course  I have reviewed the triage vital signs and the nursing notes.  Pertinent labs & imaging results that were available during my care of the patient were reviewed by me and considered in my medical decision making (see chart for details).     Patient with hematuria. Apparent urinary tract infection on urinalysis. Not on anticoagulation. No other signs of bleeding or coagulopathy such as petechiae or other bruises. Patient would not tolerate perineal examination. At this point I think it is reasonable to just treat the UTI. Culture was sent. Discussed with patient's daughter. Discharge home.  Final Clinical Impressions(s) / ED Diagnoses   Final diagnoses:  Acute cystitis with hematuria    New Prescriptions Discharge Medication List as of 10/05/2016  3:51 PM    START taking these medications   Details  cephALEXin (KEFLEX) 250 MG/5ML suspension Take 10 mLs (500 mg total) by mouth 4 (four) times daily., Starting Sun 10/05/2016, Until Sun 10/12/2016, Print         Benjiman CorePickering, Sanari Offner, MD 10/05/16 [phone]

## 2016-10-05 NOTE — ED Notes (Signed)
ED Provider at bedside. 

## 2016-10-05 NOTE — Discharge Instructions (Signed)
Follow with her doctor.

## 2016-10-08 LAB — URINE CULTURE: Culture: >=100000 — AB

## 2016-10-09 ENCOUNTER — Telehealth: Payer: Self-pay | Admitting: Emergency Medicine

## 2016-10-09 NOTE — Telephone Encounter (Signed)
Post ED Visit - Positive Culture Follow-up  Culture report reviewed by antimicrobial stewardship pharmacist:  []  [name], Pharm.D. []  Celedonio MiyamotoJeremy Frens, Pharm.D., BCPS AQ-ID [x]  Garvin FilaMike Maccia, Pharm.D., BCPS []  Georgina PillionElizabeth Martin, Pharm.D., BCPS []  Mountain PineMinh Pham, 17[address] Rainbow BoulevardPharm.D., BCPS, AAHIVP []  Estella HuskMichelle Turner, Pharm.D., BCPS, AAHIVP []  Lysle Pearlachel Rumbarger, PharmD, BCPS []  Casilda Carlsaylor Stone, PharmD, BCPS []  Pollyann SamplesAndy Johnston, PharmD, BCPS  Positive urine culture Treated with cephalexin, organism sensitive to the same and no further patient follow-up is required at this time.  Berle MullMiller, Mande Auvil 10/09/2016, 1:02 PM

## 2016-10-29 ENCOUNTER — Emergency Department (HOSPITAL_COMMUNITY)
Admission: EM | Admit: 2016-10-29 | Discharge: 2016-10-29 | Disposition: A | Discharge status: Home / Self Care | Payer: Medicare Other | Attending: Emergency Medicine | Admitting: Emergency Medicine

## 2016-10-29 ENCOUNTER — Encounter (HOSPITAL_COMMUNITY): Payer: Self-pay | Admitting: Emergency Medicine

## 2016-10-29 DIAGNOSIS — T148XXA Other injury of unspecified body region, initial encounter: Secondary | ICD-10-CM | POA: Insufficient documentation

## 2016-10-29 DIAGNOSIS — I10 Essential (primary) hypertension: Secondary | ICD-10-CM | POA: Diagnosis not present

## 2016-10-29 DIAGNOSIS — Y999 Unspecified external cause status: Secondary | ICD-10-CM | POA: Diagnosis not present

## 2016-10-29 DIAGNOSIS — Z79899 Other long term (current) drug therapy: Secondary | ICD-10-CM | POA: Insufficient documentation

## 2016-10-29 DIAGNOSIS — S0990XA Unspecified injury of head, initial encounter: Secondary | ICD-10-CM

## 2016-10-29 DIAGNOSIS — Z87891 Personal history of nicotine dependence: Secondary | ICD-10-CM | POA: Diagnosis not present

## 2016-10-29 DIAGNOSIS — Y9384 Activity, sleeping: Secondary | ICD-10-CM | POA: Insufficient documentation

## 2016-10-29 DIAGNOSIS — Y92122 Bedroom in nursing home as the place of occurrence of the external cause: Secondary | ICD-10-CM | POA: Insufficient documentation

## 2016-10-29 DIAGNOSIS — X58XXXA Exposure to other specified factors, initial encounter: Secondary | ICD-10-CM | POA: Insufficient documentation

## 2016-10-29 NOTE — Discharge Instructions (Signed)
Please read attached information. If you experience any new or worsening signs or symptoms please return to the emergency room for evaluation.  °

## 2016-10-29 NOTE — ED Provider Notes (Signed)
  Face-to-face evaluation   History: Reported head injury, patient found in bed with hematoma on scalp.  Patient at baseline per daughter.  Daughter does not want any evaluations, or interventions at this time  Physical exam: Alert elderly female.  She is able ambulate with a shuffling gait.  She does not appear uncomfortable.  Medical screening examination/treatment/procedure(s) were conducted as a shared visit with non-physician practitioner(s) and myself.  I personally evaluated the patient during the encounter   Mancel BaleWentz, Aireana Ryland, MD 10/31/16 1056

## 2016-10-29 NOTE — ED Provider Notes (Signed)
[facility]-EMERGENCY DEPT Provider Note   CSN: [id number] Arrival date & time: 10/29/16  1446   History   Chief Complaint Chief Complaint  Patient presents with  . Fall  . Head Injury    HPI Lonie Rummell is a 83 y.o. female.  HPI   level 5 caveat due to dementia  83 year old female presents today with daughter with reports of head injury.  Daughter reports history of advanced dementia with agitation.  She notes nursing staff reports they found her in her bed this morning with a hematoma to the back part of her head.  Daughter notes patient is at baseline with no signs of other trauma or physical complaints.  Daughter is requesting no significant interventions as this will cause significant agitation and the patient.  She reports that even if concerning findings are found they would not want to proceed with any surgical evaluation.  Uncertain tetanus status.  Past Medical History:  Diagnosis Date  . Anxiety   . Dementia   . Glaucoma   . HLD (hyperlipidemia)   . Hypertension     There are no active problems to display for this patient.   Past Surgical History:  Procedure Laterality Date  . ABDOMINAL HYSTERECTOMY    . ABDOMINAL SURGERY      OB History    No data available       Home Medications    Prior to Admission medications   Medication Sig Start Date End Date Taking? Authorizing Provider  acetaminophen (TYLENOL) 325 MG tablet Take 325 mg by mouth every 4 (four) hours as needed for mild pain, moderate pain or headache.    [provider]  Cholecalciferol (VITAMIN D3) 2000 UNITS TABS Take 2,000 Units by mouth daily.    [provider]  divalproex (DEPAKOTE) 125 MG DR tablet Take 125 mg by mouth 2 (two) times daily.    [provider]  hydrocortisone cream 1 % Apply 1 application topically 2 (two) times daily. Apply 2 inches topically to neck, back, and between thighs twice daily    [provider]  ibuprofen (ADVIL,MOTRIN) 400 MG  tablet Take 400 mg by mouth every 6 (six) hours as needed for headache, mild pain or moderate pain.    [provider]  latanoprost (XALATAN) 0.005 % ophthalmic solution Place 1 drop into both eyes at bedtime.    [provider]  LORazepam (ATIVAN) 0.5 MG tablet Take 0.25 mg by mouth daily.    [provider]  mirtazapine (REMERON) 7.5 MG tablet Take 7.5 mg by mouth at bedtime. 03/04/15   [provider]  sertraline (ZOLOFT) 20 MG/ML concentrated solution Take by mouth daily.    [provider]  timolol (BETIMOL) 0.5 % ophthalmic solution Place 1 drop into both eyes 2 (two) times daily.    [provider]    Family History Family History  Problem Relation Age of Onset  . Family history unknown: Yes    Social History Social History  Substance Use Topics  . Smoking status: Former Games developer  . Smokeless tobacco: Never Used  . Alcohol use No     Allergies   Multivitamin [centrum]   Review of Systems Review of Systems  Unable to perform ROS: Dementia     Physical Exam Updated Vital Signs BP 93/78 (BP Location: Left Arm)   Pulse 80   Temp 98 F (36.7 C) (Oral)   Resp 17   Ht 5\' 2"  (1.575 m)   Wt 49.4 kg (  109 lb)   SpO2 97%   BMI 19.94 kg/m   Physical Exam  Constitutional: She is oriented to person, place, and time. She appears well-developed and well-nourished.  HENT:  Head: Normocephalic.  Hematoma to the posterior scalp, no obvious laceration  Eyes: Pupils are equal, round, and reactive to light. Conjunctivae are normal. Right eye exhibits no discharge. Left eye exhibits no discharge. No scleral icterus.  Neck: Normal range of motion. No JVD present. No tracheal deviation present.  Pulmonary/Chest: Effort normal. No stridor.  Neurological: She is alert and oriented to person, place, and time. Coordination normal.  Psychiatric: She has a normal mood and affect. Her behavior is normal. Judgment and thought content  normal.  Nursing note and vitals reviewed.    ED Treatments / Results  Labs (all labs ordered are listed, but only abnormal results are displayed) Labs Reviewed - No data to display  EKG  EKG Interpretation None       Radiology No results found.  Procedures Procedures (including critical care time)  Medications Ordered in ED Medications - No data to display   Initial Impression / Assessment and Plan / ED Course  I have reviewed the triage vital signs and the nursing notes.  Pertinent labs & imaging results that were available during my care of the patient were reviewed by me and considered in my medical decision making (see chart for details).     Final Clinical Impressions(s) / ED Diagnoses   Final diagnoses:  Injury of head, initial encounter  Hematoma   Assessment/Plan: 83-year-old female presents today with head injury.  Daughter is very realistic they do not want any significant intervention in the event any abnormalities were found.  They do not want patient to be agitated, CT scan not indicated at this time as patient is at her baseline, no interventions will be done in the event any concerning findings are found.  Uncertain tetanus status, again I discussed the risks and benefits of tetanus immunization in this patient.  Daughter would like to forego vaccination as this will cause significant agitation and this 83 year old demented patient.  I find this completely reasonable.  Patient will be discharged home with wound care instructions as I was unable to thoroughly clean wound given patient's agitation.  Daughter will monitor for signs of infection or any change from baseline and follow-up as needed.    New Prescriptions New Prescriptions   No medications on file     Rosalio LoudHedges, [place] Bryngelson, PA-C 10/29/16 1628    Mancel BaleWentz, Elliott, MD 10/31/16 1056

## 2016-10-29 NOTE — ED Triage Notes (Signed)
Pt had unwitnessed fall yesterday. Hematoma to posterior head. Dementia, mental status at baseline. Gait normal. Strength symmetrical, appropriate for age. Right pupil smaller as compared to right, family unsure if this is new or baseline.

## 2016-10-29 NOTE — ED Notes (Signed)
Patient was alert, oriented and stable upon discharge. RN went over AVS and patient had no further questions.  

## 2016-10-29 NOTE — ED Notes (Signed)
Patient arrived in room 17 and writer states they went into room to put patient in gown and put patient on the monitor. Patient's daughter Pharmacist, hospitallet writer know that patient did not need to be put on monitor or anything that was not necessary as it would make the patient very agitated. RN made aware.

## 2017-04-17 DEATH — deceased

## 2017-06-27 IMAGING — CT CT HEAD W/O CM
3 of 6 series · 14 of 47 positions shown, 16 images · non-contrast
Comparison: None.

CLINICAL DATA: Unwitnessed trip and fall, hitting head on [place]
ledge. Right forehead abrasion. Initial encounter.

EXAM:
CT HEAD WITHOUT CONTRAST
CT CERVICAL SPINE WITHOUT CONTRAST
TECHNIQUE: Multidetector CT imaging of the head and cervical spine was
performed following the standard protocol without intravenous
contrast. Multiplanar CT image reconstructions of the cervical spine
were also generated.

[Series 7: coronal · coronal · 0.23mm/px · 3 of 52 slices shown]
[im 18/52  brain]
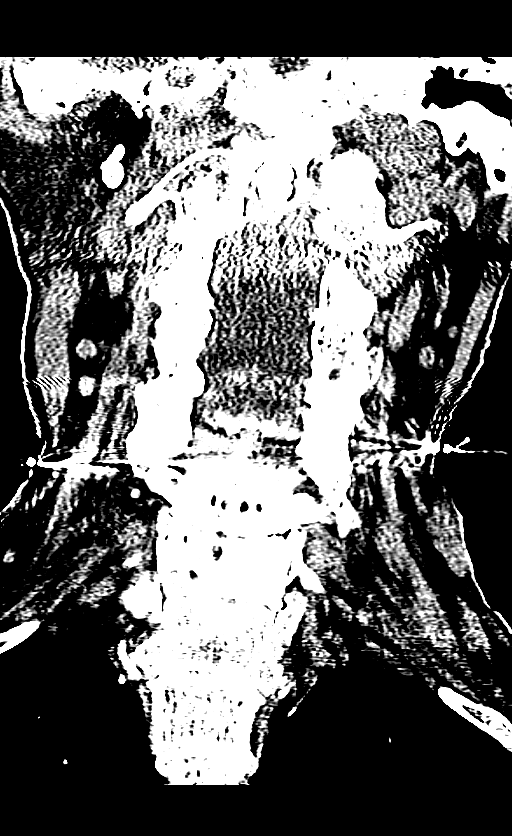
[im 23/52  brain]
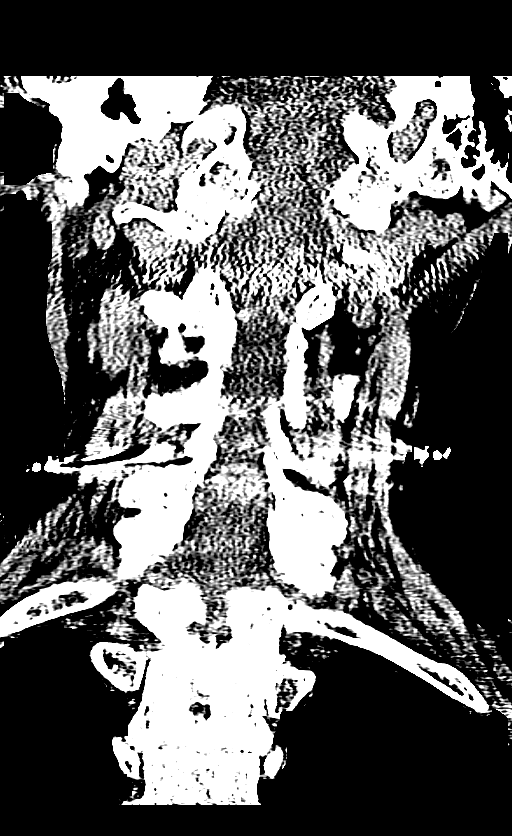
[im 29/52  brain]
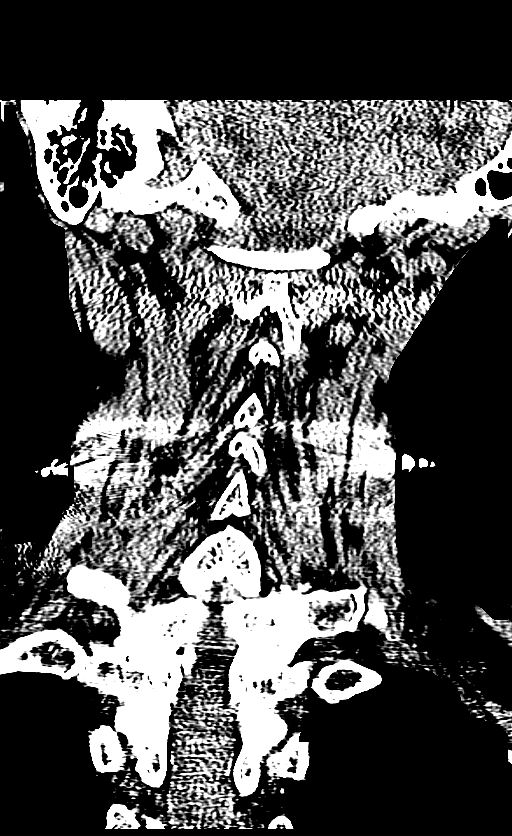

[Series 8: sagittal · sagittal · 0.30mm/px · 3 of 41 slices shown]
[im 14/41  brain]
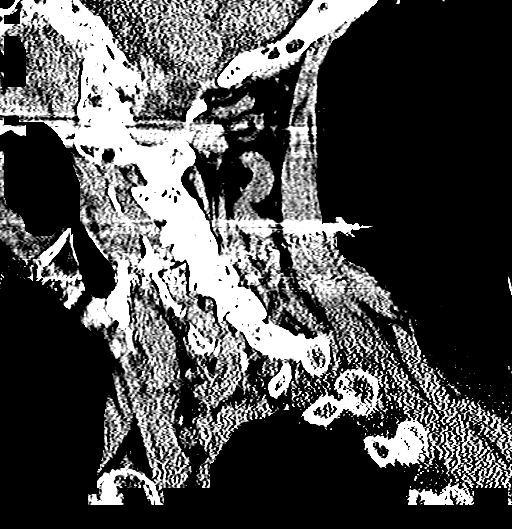
[im 21/41  brain]
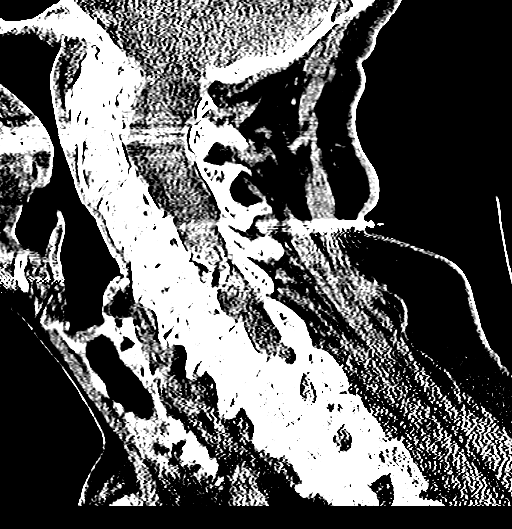
[im 27/41  brain]
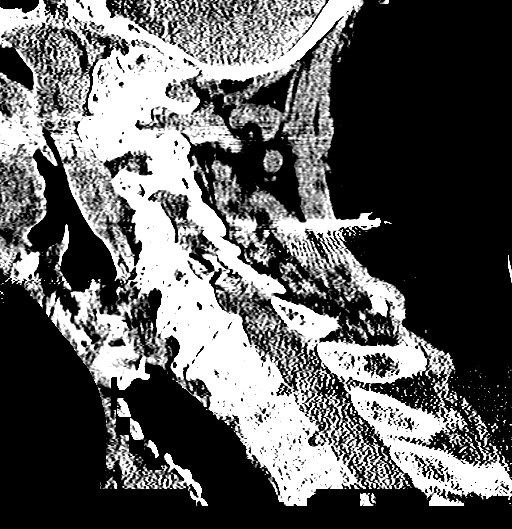

[Series 9: axial recon · axial · 0.23mm/px · z∈[-296,-168]mm · 8 of 93 slices shown, 10 images]
[im 10/93  brain]
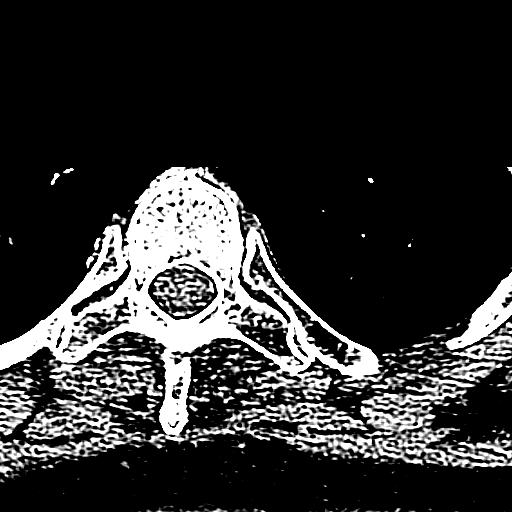
[im 10/93  bone]
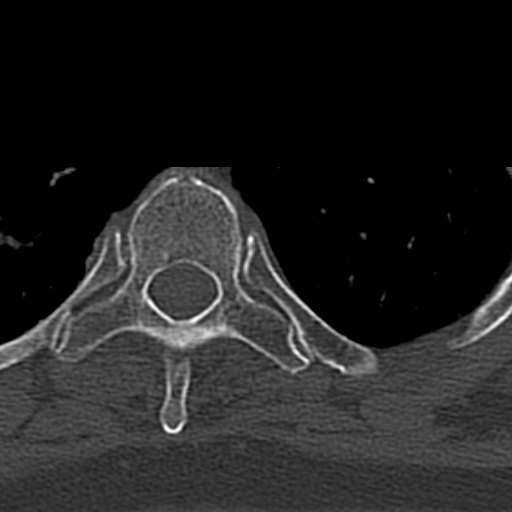
[im 19/93  brain]
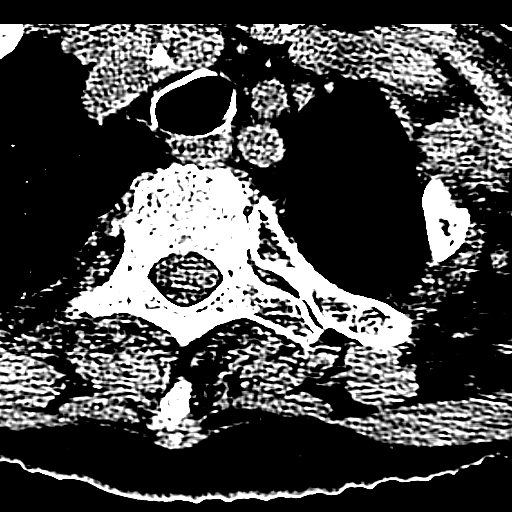
[im 28/93  brain]
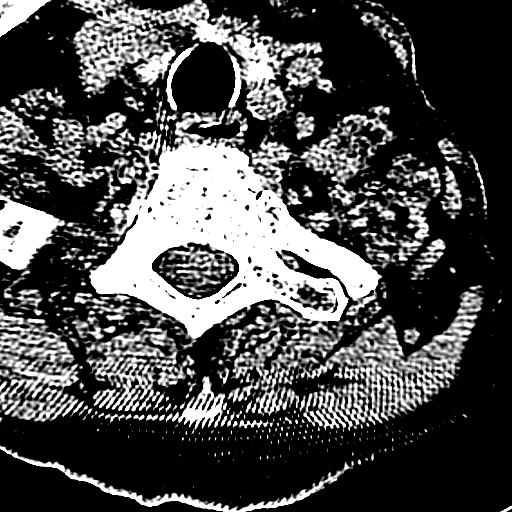
[im 37/93  brain]
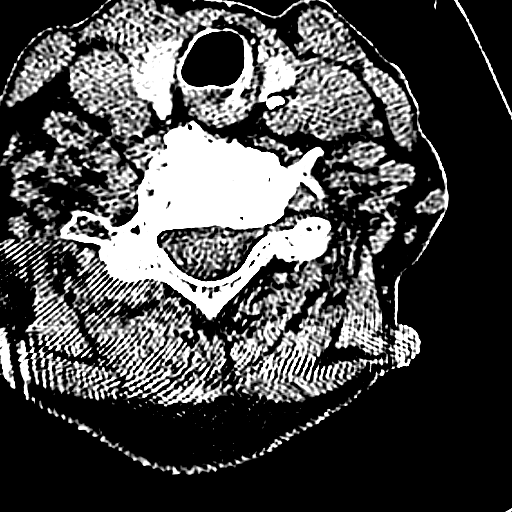
[im 56/93  brain]
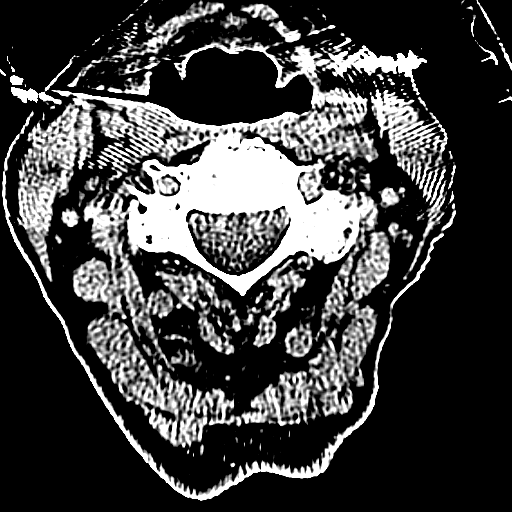
[im 56/93  bone]
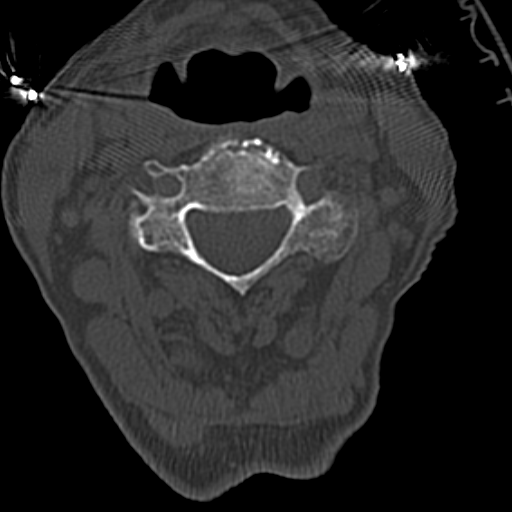
[im 65/93  brain]
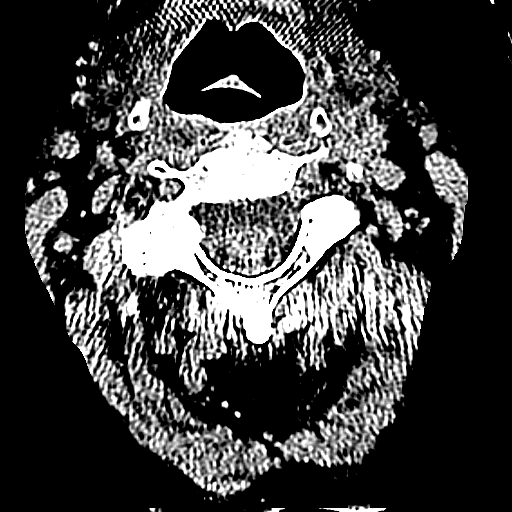
[im 74/93  brain]
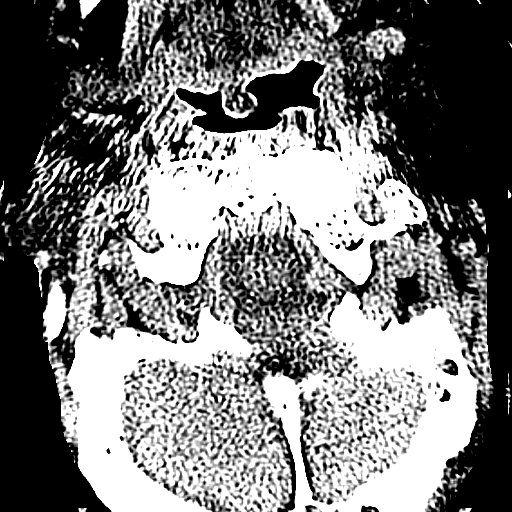
[im 83/93  brain]
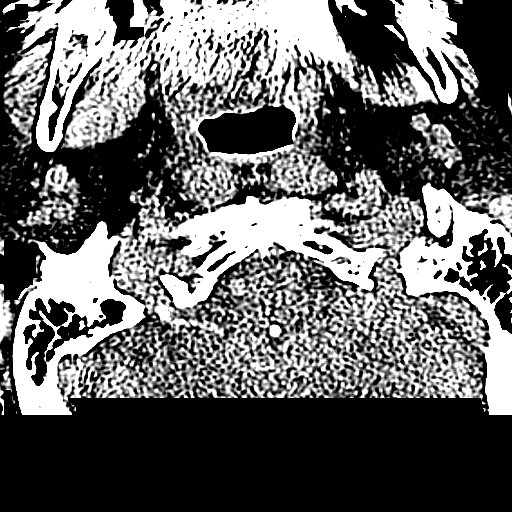

[14 of 47 positions shown; findings below may reference images not displayed]

FINDINGS: CT HEAD FINDINGS

A subcentimeter infarct at the junction of the midbrain and pons
right of midline appears chronic. There are additional infarcts in
the left greater than right basal ganglia and left thalamus which
are of indeterminate acuity. Punctate density in the left globus
pallidus is favored to reflect calcification. There is no definite
evidence of acute intracranial hemorrhage. No mass, midline shift,
or extra-axial fluid collection is seen. There is moderate cerebral
atrophy. Periventricular white-matter hypodensities are nonspecific
but compatible with moderate chronic small vessel ischemic disease.

Prior bilateral cataract extraction is noted. There is mild right
frontal scalp swelling. The visualized paranasal sinuses and mastoid
air cells are clear. No skull fracture is seen. Calcified
atherosclerosis is noted at the skullbase.

CT CERVICAL SPINE FINDINGS

There is straightening of the normal cervical lordosis. There is
trace anterolisthesis of C3 on C4 and C7 on T1 and trace
retrolisthesis of C4 on C5 which appear degenerative. No acute
cervical spine fracture is identified.

Advanced atlantodental degenerative changes are present with
moderate calcified pannus noted about the dens. There is advanced
right facet arthrosis at C2-3. Posterior element ankylosis and
partial fusion across the disc spacer noted at C3-4. Moderate to
severe disc space narrowing is present from C4-5 to C7-T1.
Associated degenerative endplate changes are present. Carotid
atherosclerosis is noted.
IMPRESSION: 1. Age indeterminate infarcts in the bilateral basal ganglia and
left thalamus. Chronic upper brainstem infarct.
2. No acute intracranial hemorrhage identified.
3. Moderate chronic small vessel ischemic disease and cerebral
atrophy.
4. Right frontal scalp swelling.
5. Advanced cervical disc degeneration without acute osseous
abnormality identified.
